# Patient Record
Sex: Male | Born: 1995 | Race: Black or African American | Hispanic: No | Marital: Single | Smoking: Never smoker
Health system: Southern US, Community
[De-identification: ages and names within clinical notes are randomized; demographics above are authoritative.]

## PROBLEM LIST (undated history)

## (undated) DIAGNOSIS — F909 Attention-deficit hyperactivity disorder, unspecified type: Secondary | ICD-10-CM

## (undated) DIAGNOSIS — F988 Other specified behavioral and emotional disorders with onset usually occurring in childhood and adolescence: Secondary | ICD-10-CM

## (undated) DIAGNOSIS — D649 Anemia, unspecified: Secondary | ICD-10-CM

## (undated) HISTORY — PX: WISDOM TOOTH EXTRACTION: SHX21

---
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
## 2016-05-17 DIAGNOSIS — S82841D Displaced bimalleolar fracture of right lower leg, subsequent encounter for closed fracture with routine healing: Secondary | ICD-10-CM | POA: Insufficient documentation

## 2016-06-24 ENCOUNTER — Encounter (INDEPENDENT_AMBULATORY_CARE_PROVIDER_SITE_OTHER): Payer: Self-pay

## 2016-06-24 DIAGNOSIS — S82841D Displaced bimalleolar fracture of right lower leg, subsequent encounter for closed fracture with routine healing: Secondary | ICD-10-CM

## 2016-08-01 ENCOUNTER — Other Ambulatory Visit (INDEPENDENT_AMBULATORY_CARE_PROVIDER_SITE_OTHER): Payer: Self-pay | Admitting: Orthopedic Surgery

## 2016-08-01 DIAGNOSIS — M25571 Pain in right ankle and joints of right foot: Secondary | ICD-10-CM

## 2016-08-04 ENCOUNTER — Ambulatory Visit
Admission: RE | Admit: 2016-08-04 | Discharge: 2016-08-04 | Disposition: A | Discharge status: Home / Self Care | Payer: BLUE CROSS/BLUE SHIELD | Source: Ambulatory Visit | Attending: Orthopedic Surgery | Admitting: Orthopedic Surgery

## 2016-08-04 DIAGNOSIS — M25571 Pain in right ankle and joints of right foot: Secondary | ICD-10-CM

## 2016-08-07 ENCOUNTER — Ambulatory Visit (INDEPENDENT_AMBULATORY_CARE_PROVIDER_SITE_OTHER): Payer: BLUE CROSS/BLUE SHIELD | Admitting: Orthopedic Surgery

## 2016-08-07 DIAGNOSIS — S82841D Displaced bimalleolar fracture of right lower leg, subsequent encounter for closed fracture with routine healing: Secondary | ICD-10-CM | POA: Diagnosis not present

## 2016-09-20 ENCOUNTER — Ambulatory Visit (INDEPENDENT_AMBULATORY_CARE_PROVIDER_SITE_OTHER): Payer: BLUE CROSS/BLUE SHIELD | Admitting: Orthopedic Surgery

## 2016-09-21 ENCOUNTER — Ambulatory Visit (INDEPENDENT_AMBULATORY_CARE_PROVIDER_SITE_OTHER): Payer: BLUE CROSS/BLUE SHIELD | Admitting: Orthopedic Surgery

## 2016-09-21 ENCOUNTER — Encounter (INDEPENDENT_AMBULATORY_CARE_PROVIDER_SITE_OTHER): Payer: Self-pay | Admitting: Orthopedic Surgery

## 2016-09-21 DIAGNOSIS — S8251XK Displaced fracture of medial malleolus of right tibia, subsequent encounter for closed fracture with nonunion: Secondary | ICD-10-CM | POA: Diagnosis not present

## 2016-09-21 DIAGNOSIS — S82841D Displaced bimalleolar fracture of right lower leg, subsequent encounter for closed fracture with routine healing: Secondary | ICD-10-CM | POA: Diagnosis not present

## 2016-09-21 NOTE — Progress Notes (Signed)
Office Visit Note   Patient: Andrew Meyer           Date of Birth: 08/22/1996           MRN: [id number] Visit Date: 09/21/2016 Requested by: Farrel Gobbleyan Neil Aubuchon, MD [id number] [place], [place] [id number] PCP: Farrel GobbleAUBUCHON, RYAN NEIL, MD  Subjective: Chief Complaint  Patient presents with  . Right Ankle - Follow-up, Pain    HPI Andrew OlszewskiChamberlain is a 20 year old patient with bimalleolar ankle fracture.  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  This was a small fragment fixed with a screw and washer.  Substance CT scan shows nonunion with a little bit of lucency of the proximal tibia.  He is having some medial sided pain when he maximally dorsiflexes his foot with ambulation.  He is not playing football.  Mother has a history of vitamin D deficiency              Review of Systems All systems reviewed are negative as they relate to the chief complaint within the history of present illness.  Patient denies  fevers or chills.    Assessment & Plan: Visit Diagnoses:  1. Displaced fracture of medial malleolus of right tibia, subsequent encounter for closed fracture with nonunion   2. Closed displaced bimalleolar fracture of right lower leg with routine healing     Plan: Impression is symptomatic medial malleolar fracture nonunion.  Plan we injected the ankle last clinic visit and that did not give very much relief.  I think his main issue now is this medial malleolar nonunion.  The father knows some of the [facility] doctors in [place].  I think it be good for Andrew OlszewskiChamberlain to get another opinion about fixation options for this fracture.  This would be a difficult revision surgery which would require bone graft and novel fixation.  I did provide the copies of the records with the radiographs and scans.  I will see him back as needed.  I also drew a vitamin D level on him today and [facility].  That's a correctable problem that would need to be addressed prior to  repeat surgical fixation  Follow-Up Instructions: Return if symptoms worsen or fail to improve.   Orders:  Orders Placed This Encounter  Procedures  . VITAMIN D 25 Hydroxy (Vit-D Deficiency, Fractures)   No orders of the defined types were placed in this encounter.     Procedures: No procedures performed   Clinical Data: No additional findings.  Objective: Vital Signs: There were no vitals taken for this visit.  Physical Exam  Constitutional: He appears well-developed.  HENT:  Head: Normocephalic.  Eyes: EOM are normal.  Neck: Normal range of motion.  Cardiovascular: Normal rate.   Pulmonary/Chest: Effort normal.  Neurological: He is alert.  Skin: Skin is warm.  Psychiatric: He has a normal mood and affect.    Ortho Exam examination of the right foot demonstrates well-healed surgical incision he does have essentially symmetric ankle dorsiflexion bilaterally.  Pedal pulses palpable.  Has symmetric tibiotalar subtalar transverse tarsal range of motion.  There is no skin temperature difference color difference in the right foot versus left foot  Specialty Comments:  No specialty comments available.  Imaging: No results found.   PMFS History: Patient Active Problem List   Diagnosis Date Noted  . Closed displaced bimalleolar fracture of right lower leg with routine healing 05/17/2016   Past Medical History:  Diagnosis Date  .  ADD (attention deficit disorder)   . ADHD (attention deficit hyperactivity disorder)   . Anemia    as a child    Family History  Problem Relation Age of Onset  . Hypertension Father     Past Surgical History:  Procedure Laterality Date  . ORIF ANKLE FRACTURE Right 12/30/2015   Procedure: RIGHT OPEN REDUCTION INTERNAL FIXATION (ORIF) ANKLE FRACTURE;  Surgeon: Cammy CopaScott Nayda Riesen, MD;  Location: [facility] OR;  Service: Orthopedics;  Laterality: Right;  . WISDOM TOOTH EXTRACTION     Social History   Occupational History  . Not on file.    Social History Main Topics  . Smoking status: Never Smoker  . Smokeless tobacco: Never Used  . Alcohol use No  . Drug use: No  . Sexual activity: Not on file

## 2016-09-22 ENCOUNTER — Telehealth (INDEPENDENT_AMBULATORY_CARE_PROVIDER_SITE_OTHER): Payer: Self-pay | Admitting: Orthopedic Surgery

## 2016-09-22 LAB — VITAMIN D 25 HYDROXY (VIT D DEFICIENCY, FRACTURES): VIT D 25 HYDROXY: 12 ng/mL — AB (ref 30–100)

## 2016-09-22 NOTE — Telephone Encounter (Signed)
I called the patient and, as per Dr. Diamantina Providenceean's note, left a message stating that his Vitamin D levels were low as Dr. August Saucerean suspected and that he needs to take 50,000 units of vitamin D PO q week for eight weeks. I asked him to call our office if he had any questions.

## 2016-10-06 ENCOUNTER — Telehealth (INDEPENDENT_AMBULATORY_CARE_PROVIDER_SITE_OTHER): Payer: Self-pay | Admitting: *Deleted

## 2016-10-06 NOTE — Telephone Encounter (Signed)
[facility] of the CT Scan, we cannot do this.

## 2016-10-06 NOTE — Telephone Encounter (Signed)
I gave patient [phone].  He will call for copy and reading

## 2016-10-06 NOTE — Telephone Encounter (Signed)
Pt called requesting copy of CT scan for [place]. Pt CB: [phone]

## 2016-10-19 ENCOUNTER — Telehealth (INDEPENDENT_AMBULATORY_CARE_PROVIDER_SITE_OTHER): Payer: Self-pay | Admitting: *Deleted

## 2016-10-19 MED ORDER — VITAMIN D (ERGOCALCIFEROL) 1.25 MG (50000 UNIT) PO CAPS: 50000.0000 [IU] | ORAL_CAPSULE | ORAL | 0 refills | Status: AC

## 2016-10-19 NOTE — Telephone Encounter (Signed)
Pt called stating he was told to take 50,000 units of Vitamin D. Pt stated pharmacist said he needs a RX for this.

## 2016-10-19 NOTE — Telephone Encounter (Signed)
Rx now sent to patient's pharm

## 2018-03-11 IMAGING — CT CT ANKLE*R* W/O CM
3 series · 15 of 33 positions shown, 18 images · non-contrast
Comparison: 12/29/2015

CLINICAL DATA: History of ankle fracture 6 months ago. Injured
December 2015 status post surgery December 2015.

EXAM:
CT OF THE RIGHT ANKLE WITHOUT CONTRAST
TECHNIQUE: Multidetector CT imaging of the right ankle was performed according
to the standard protocol. Multiplanar CT image reconstructions were
also generated.

[Series 4: soft tissue lower extremity · axial · 0.41mm/px · z∈[+70,+242]mm · 7 of 102 slices shown, 9 images]
[im 8/102  soft-tissue]
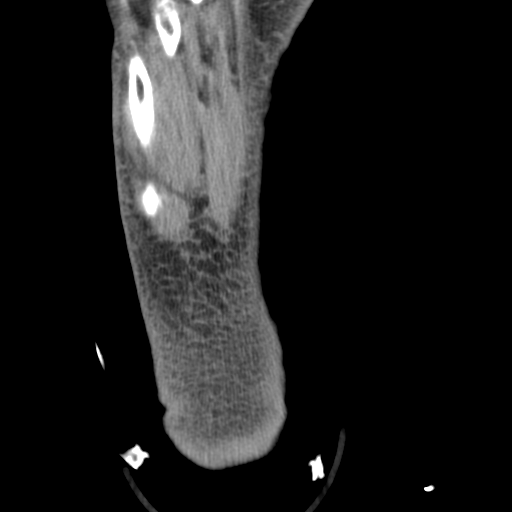
[im 8/102  bone]
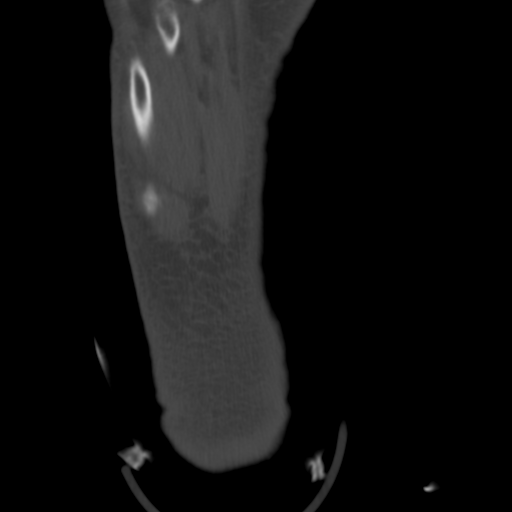
[im 24/102  bone]
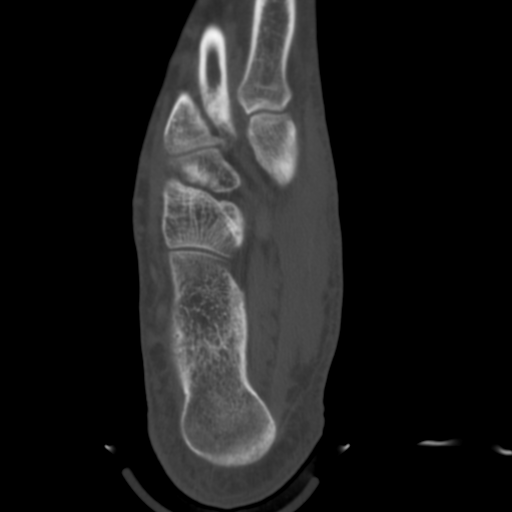
[im 39/102  bone]
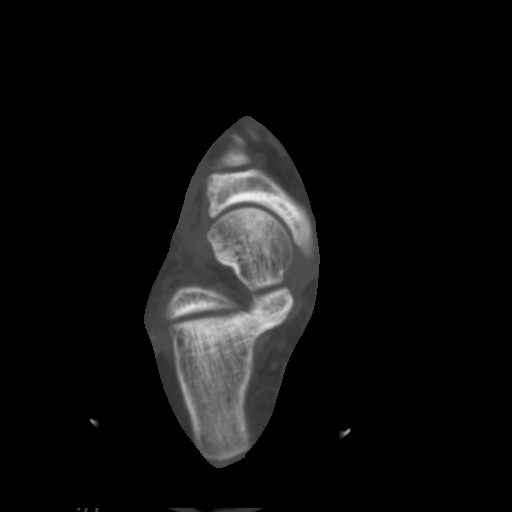
[im 55/102  bone]
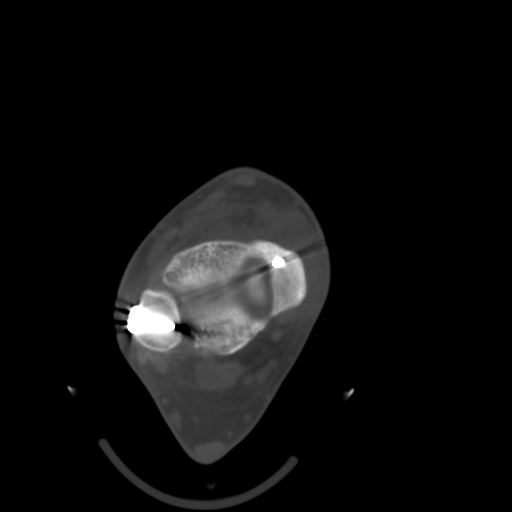
[im 63/102  soft-tissue]
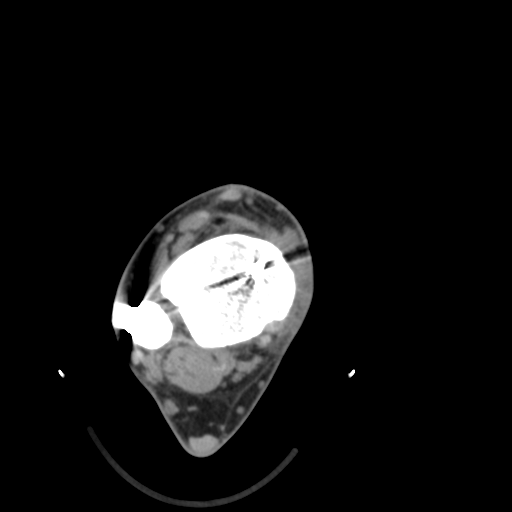
[im 63/102  bone]
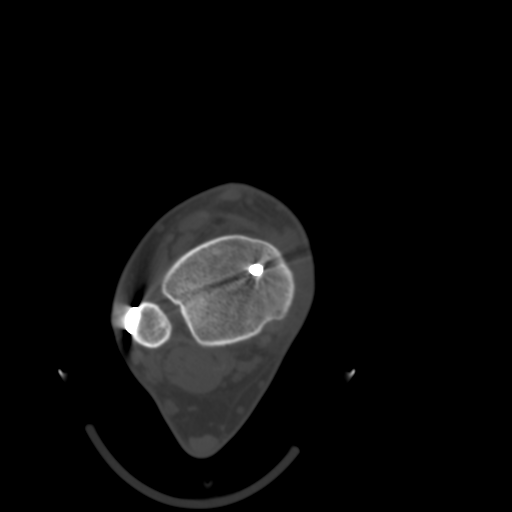
[im 78/102  bone]
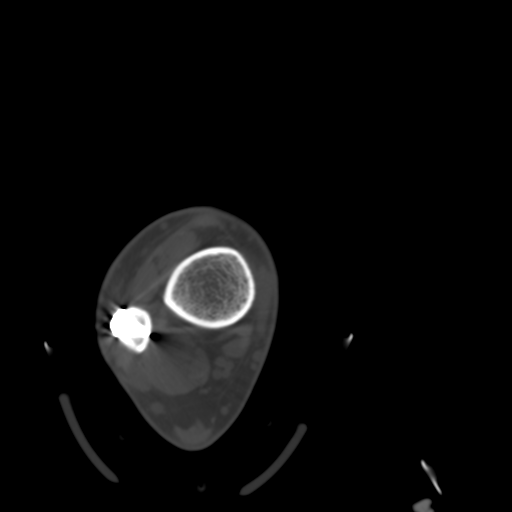
[im 94/102  bone]
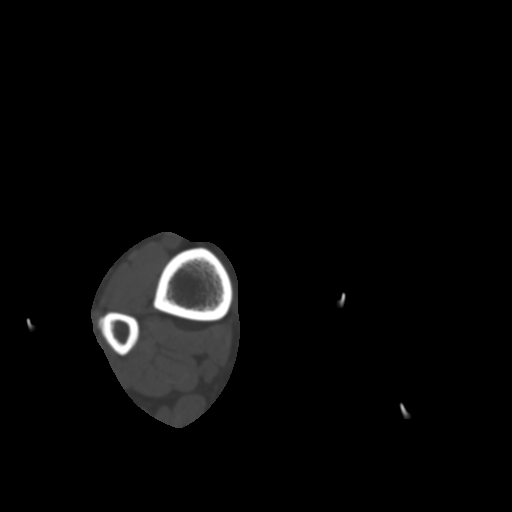

[Series 9: cor soft tissue · coronal · 0.25mm/px · 3 of 101 slices shown]
[im 21/101  bone]
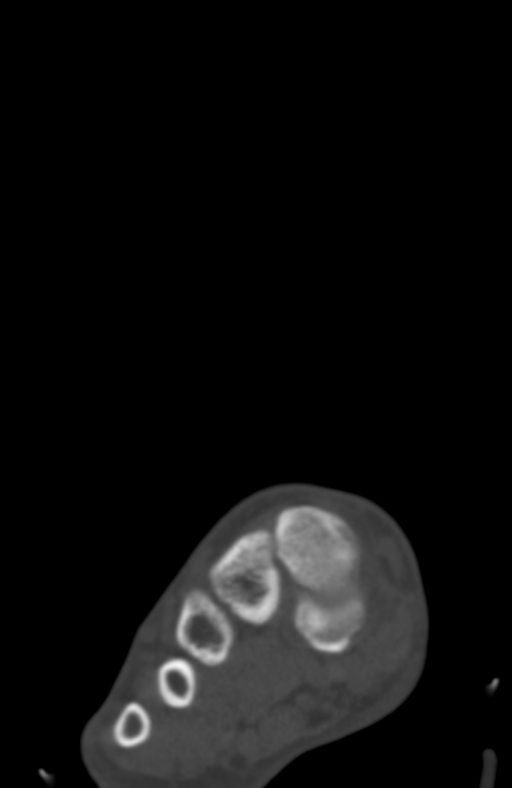
[im 41/101  bone]
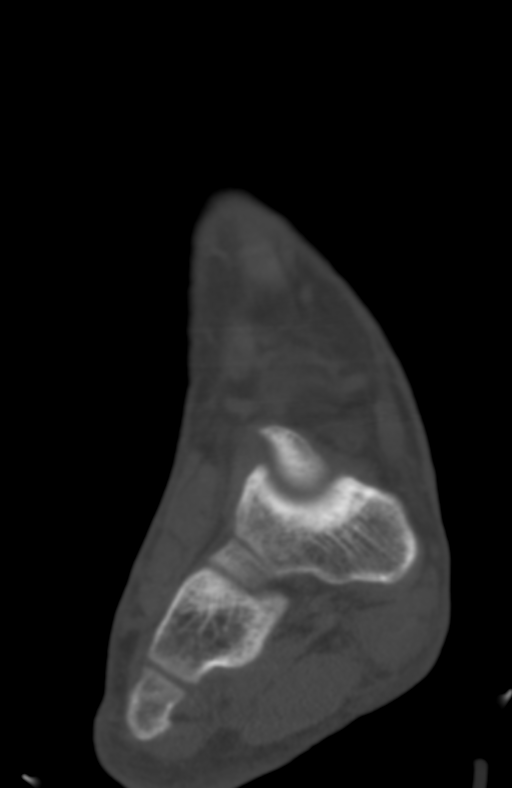
[im 61/101  bone]
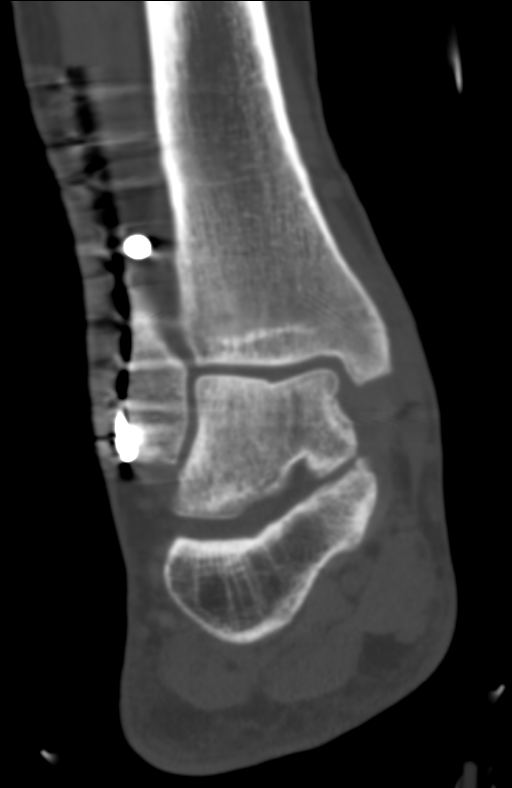

[Series 10: sagsoft tissue · sagittal · 0.39mm/px · 5 of 56 slices shown, 6 images]
[im 19/56  bone]
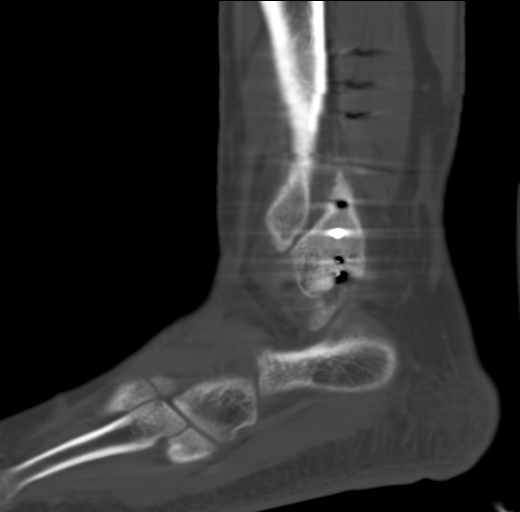
[im 23/56  bone]
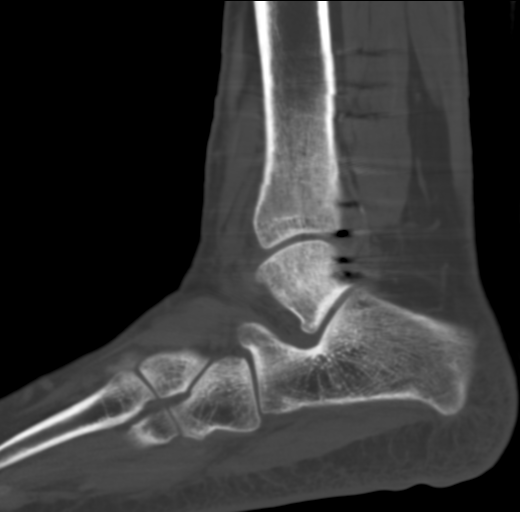
[im 28/56  soft-tissue]
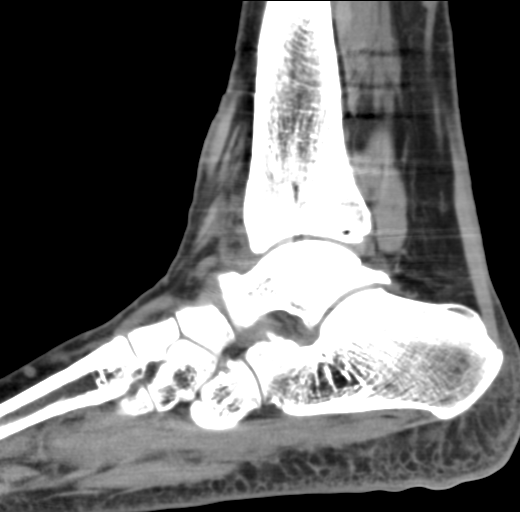
[im 28/56  bone]
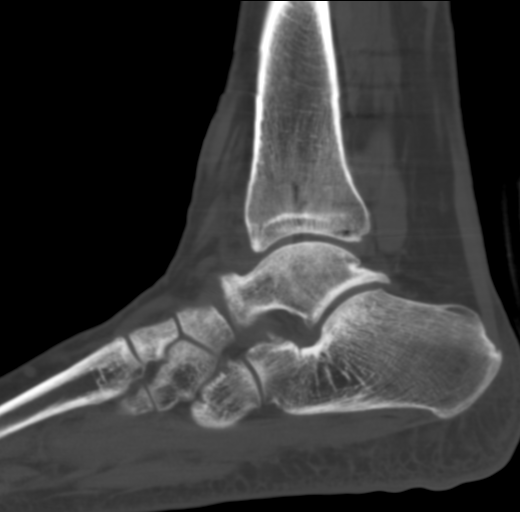
[im 33/56  bone]
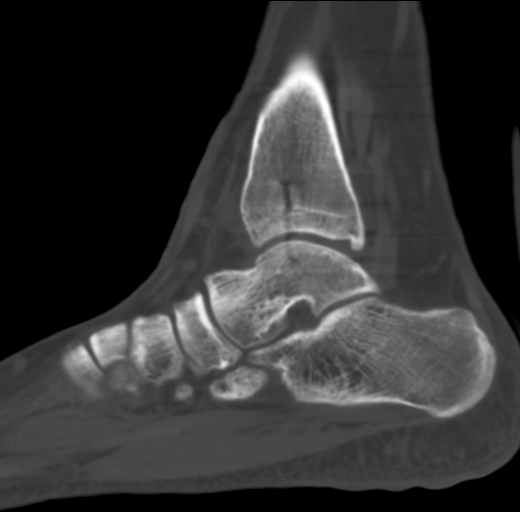
[im 37/56  bone]
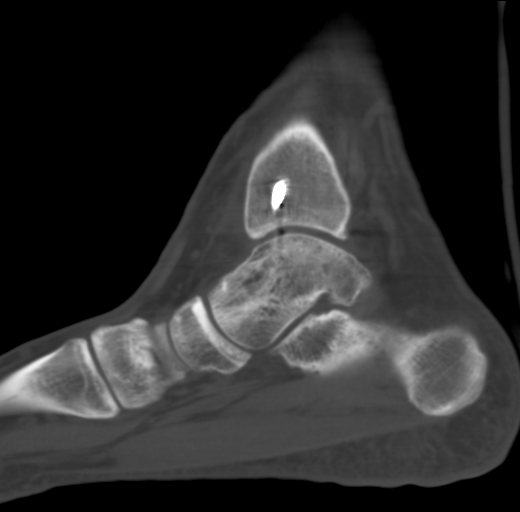

[15 of 33 positions shown; findings below may reference images not displayed]

FINDINGS: Bones/Joint/Cartilage

No acute fracture or dislocation. Intact ankle mortise. Normal
subtalar joints.

Ununited medial malleolar fracture transfixed with a single lag
screw. No significant callus formation.

Heel distal fibular fracture transfixed with a lateral sideplate and
multiple interlocking screws without failure or complication.

No aggressive lytic or sclerotic osseous lesion.

No significant joint effusion.

Ligaments

Suboptimally assessed by CT.

Muscles and Tendons

Muscles are normal. Flexor, extensor, peroneal and Achilles tendons
are grossly intact.

Soft tissues

No soft tissue mass.  No fluid collection or hematoma.
IMPRESSION: 1. Ununited medial malleolar fracture transfixed with a single lag
screw. No significant callus formation.
2. Healed distal fibular fracture without hardware failure or
complication.
